# Patient Record
Sex: Female | Born: 1935 | Race: Black or African American | Hispanic: No | Marital: Married
Health system: Southern US, Community
[De-identification: ages and names within clinical notes are randomized; demographics above are authoritative.]

---

## 2008-11-17 ENCOUNTER — Inpatient Hospital Stay: Payer: Self-pay | Admitting: Internal Medicine

## 2011-12-31 ENCOUNTER — Emergency Department: Payer: Self-pay | Admitting: Emergency Medicine

## 2011-12-31 LAB — CBC WITH DIFFERENTIAL/PLATELET
Eosinophil #: 0.1 10*3/uL (ref 0.0–0.7)
Eosinophil %: 2 %
HCT: 40.2 % (ref 35.0–47.0)
Lymphocyte #: 1.7 10*3/uL (ref 1.0–3.6)
MCH: 27.3 pg (ref 26.0–34.0)
MCHC: 31.9 g/dL — ABNORMAL LOW (ref 32.0–36.0)
MCV: 85 fL (ref 80–100)
Monocyte #: 0.5 x10 3/mm (ref 0.2–0.9)
Monocyte %: 9.9 %
Neutrophil %: 57.5 %
RDW: 14.4 % (ref 11.5–14.5)

## 2011-12-31 LAB — COMPREHENSIVE METABOLIC PANEL
Alkaline Phosphatase: 122 U/L (ref 50–136)
BUN: 19 mg/dL — ABNORMAL HIGH (ref 7–18)
Calcium, Total: 9.2 mg/dL (ref 8.5–10.1)
Chloride: 100 mmol/L (ref 98–107)
Co2: 23 mmol/L (ref 21–32)
Creatinine: 0.65 mg/dL (ref 0.60–1.30)
EGFR (Non-African Amer.): >60
SGPT (ALT): 19 U/L
Sodium: 135 mmol/L — ABNORMAL LOW (ref 136–145)
Total Protein: 8.1 g/dL (ref 6.4–8.2)

## 2011-12-31 LAB — CK TOTAL AND CKMB (NOT AT ARMC)
CK, Total: 104 U/L (ref 21–215)
CK-MB: 2.9 ng/mL (ref 0.5–3.6)

## 2011-12-31 LAB — PROTIME-INR: Prothrombin Time: 12.6 secs (ref 11.5–14.7)

## 2012-03-01 ENCOUNTER — Inpatient Hospital Stay: Payer: Self-pay | Admitting: Internal Medicine

## 2012-03-01 DIAGNOSIS — I369 Nonrheumatic tricuspid valve disorder, unspecified: Secondary | ICD-10-CM

## 2012-03-01 LAB — TROPONIN I
Troponin-I: <0.02 ng/mL
Troponin-I: 0.02 ng/mL

## 2012-03-01 LAB — CBC WITH DIFFERENTIAL/PLATELET
Basophil #: 0 10*3/uL (ref 0.0–0.1)
Eosinophil #: 0.1 10*3/uL (ref 0.0–0.7)
HCT: 45.1 % (ref 35.0–47.0)
HGB: 14.6 g/dL (ref 12.0–16.0)
Lymphocyte #: 1.2 10*3/uL (ref 1.0–3.6)
Lymphocyte %: 20.2 %
MCH: 27.8 pg (ref 26.0–34.0)
MCHC: 32.3 g/dL (ref 32.0–36.0)
Monocyte #: 0.4 x10 3/mm (ref 0.2–0.9)
Neutrophil %: 69.9 %
RBC: 5.24 10*6/uL — ABNORMAL HIGH (ref 3.80–5.20)

## 2012-03-01 LAB — COMPREHENSIVE METABOLIC PANEL
Albumin: 3.7 g/dL (ref 3.4–5.0)
Anion Gap: 9 (ref 7–16)
BUN: 12 mg/dL (ref 7–18)
Calcium, Total: 9.5 mg/dL (ref 8.5–10.1)
Chloride: 106 mmol/L (ref 98–107)
EGFR (Non-African Amer.): >60
Glucose: 138 mg/dL — ABNORMAL HIGH (ref 65–99)
Osmolality: 283 (ref 275–301)
Potassium: 4.3 mmol/L (ref 3.5–5.1)
SGOT(AST): 33 U/L (ref 15–37)
SGPT (ALT): 21 U/L (ref 12–78)
Sodium: 141 mmol/L (ref 136–145)

## 2012-03-01 LAB — CK TOTAL AND CKMB (NOT AT ARMC)
CK, Total: 103 U/L (ref 21–215)
CK-MB: 3 ng/mL (ref 0.5–3.6)
CK-MB: 3.5 ng/mL (ref 0.5–3.6)

## 2012-03-02 LAB — CBC WITH DIFFERENTIAL/PLATELET
Basophil #: 0 10*3/uL (ref 0.0–0.1)
Eosinophil #: 0 10*3/uL (ref 0.0–0.7)
HCT: 41.2 % (ref 35.0–47.0)
Lymphocyte %: 20.6 %
MCH: 28.1 pg (ref 26.0–34.0)
Monocyte #: 0.2 x10 3/mm (ref 0.2–0.9)
Neutrophil #: 2.9 10*3/uL (ref 1.4–6.5)
Neutrophil %: 74.4 %
Platelet: 193 10*3/uL (ref 150–440)
RBC: 4.82 10*6/uL (ref 3.80–5.20)
RDW: 15.5 % — ABNORMAL HIGH (ref 11.5–14.5)
WBC: 3.9 10*3/uL (ref 3.6–11.0)

## 2012-03-02 LAB — COMPREHENSIVE METABOLIC PANEL
Albumin: 3.4 g/dL (ref 3.4–5.0)
Alkaline Phosphatase: 130 U/L (ref 50–136)
Bilirubin,Total: 0.2 mg/dL (ref 0.2–1.0)
Calcium, Total: 9.2 mg/dL (ref 8.5–10.1)
Co2: 28 mmol/L (ref 21–32)
EGFR (Non-African Amer.): >60
Glucose: 101 mg/dL — ABNORMAL HIGH (ref 65–99)
Osmolality: 276 (ref 275–301)
SGOT(AST): 48 U/L — ABNORMAL HIGH (ref 15–37)
SGPT (ALT): 32 U/L (ref 12–78)
Sodium: 137 mmol/L (ref 136–145)

## 2012-03-02 LAB — TROPONIN I: Troponin-I: 0.02 ng/mL

## 2012-03-02 LAB — CK TOTAL AND CKMB (NOT AT ARMC): CK, Total: 128 U/L (ref 21–215)

## 2012-03-04 LAB — PROTIME-INR: Prothrombin Time: 11.7 secs (ref 11.5–14.7)

## 2012-03-06 LAB — CREATININE, SERUM
EGFR (African American): >60
EGFR (Non-African Amer.): >60

## 2012-03-08 ENCOUNTER — Ambulatory Visit: Payer: Self-pay | Admitting: Internal Medicine

## 2012-03-11 LAB — EXPECTORATED SPUTUM ASSESSMENT W REFEX TO RESP CULTURE

## 2012-06-13 ENCOUNTER — Ambulatory Visit: Payer: Self-pay | Admitting: Specialist

## 2012-06-28 ENCOUNTER — Ambulatory Visit: Payer: Self-pay | Admitting: Internal Medicine

## 2014-06-29 IMAGING — CT NM PET TUM IMG LTD AREA
1 of 5 series · 10 of 25 positions shown · non-contrast
Comparison: none

REASON FOR EXAM: Lung mass
COMMENTS:

PROCEDURE:     PET - PET/CT DX LUNG CA  - June 28, 2012 [DATE]
RESULT:     Comparison: PET CT/[DATE], chest CT 06/13/2012
Radiopharmaceutical: 11.61 mCi F18-FDG, intravenously.
TECHNIQUE: Imaging was performed from the skull base to the mid-thigh using
routine PET/CT acquisition protocol.
Injection site: Right antecubital fossa
Time of FDG injection: 7327 hours
Serum glucose: 97 mg/dL
Time of imaging: 7373 hours through 6484 hours

[Series 3: ct wb 3.0 b30f · axial · 3.0mm · 0.98mm/px · z∈[-1344,-634]mm · 10 of 435 slices shown]
[im 40/435  soft-tissue]
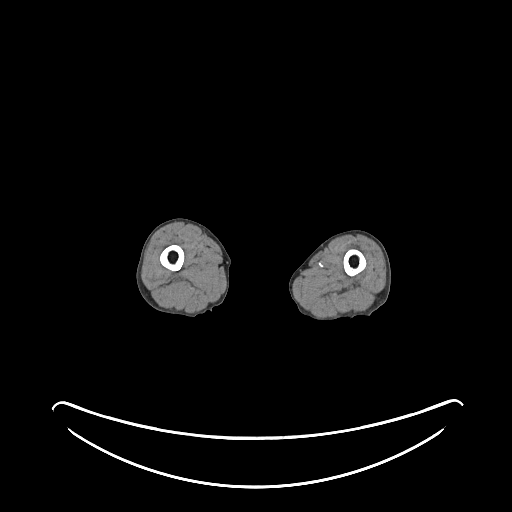
[im 79/435  soft-tissue]
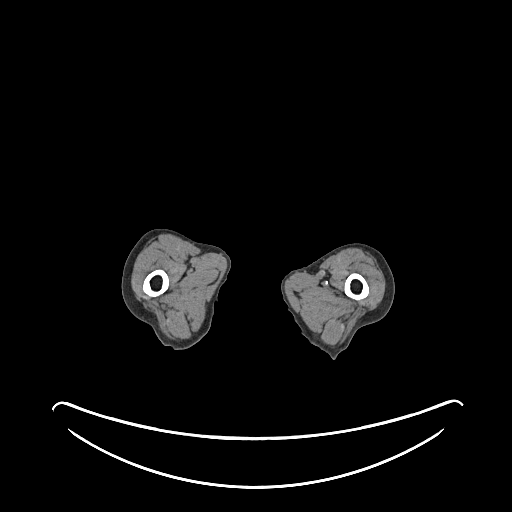
[im 119/435  soft-tissue]
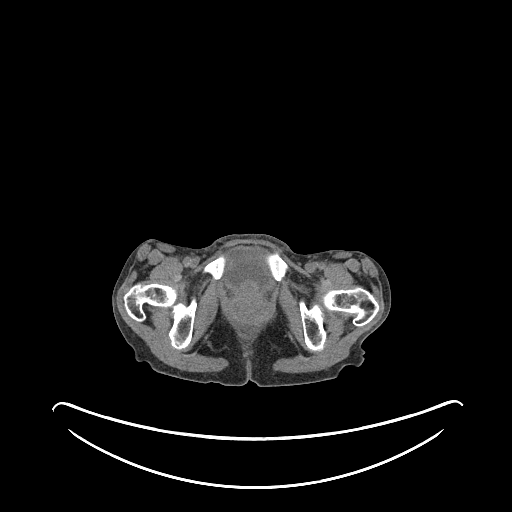
[im 158/435  soft-tissue]
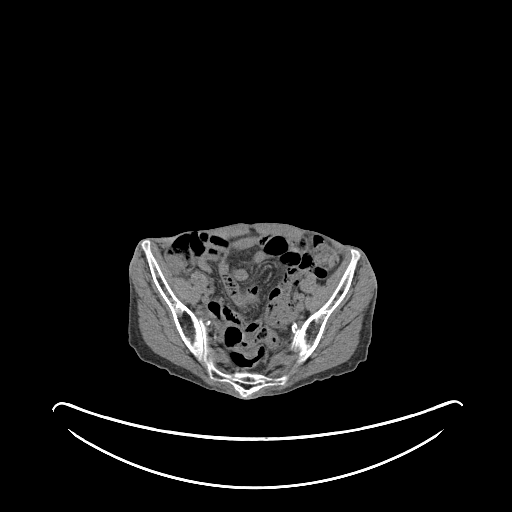
[im 198/435  soft-tissue]
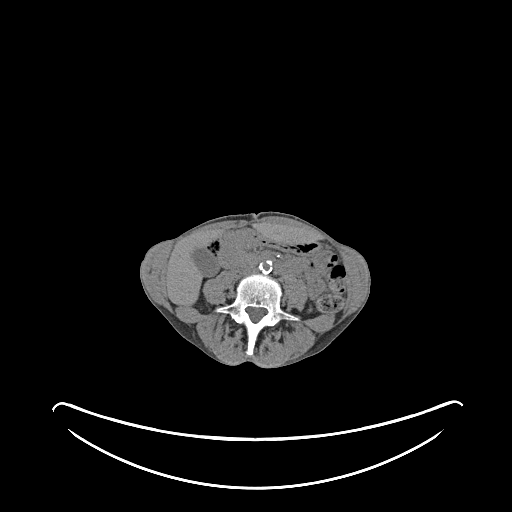
[im 237/435  soft-tissue]
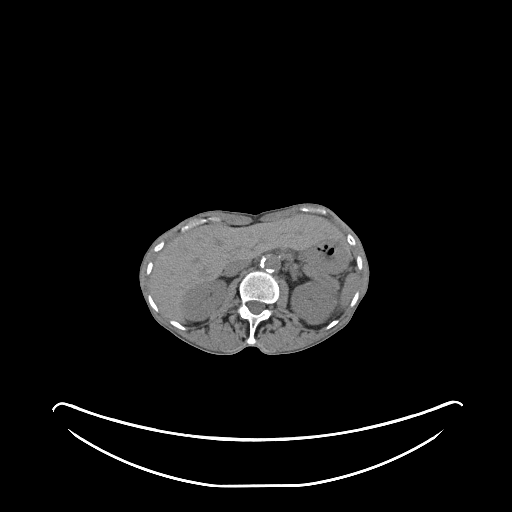
[im 277/435  soft-tissue]
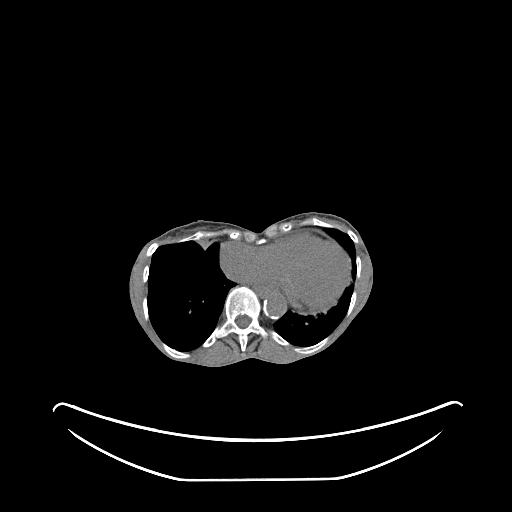
[im 316/435  soft-tissue]
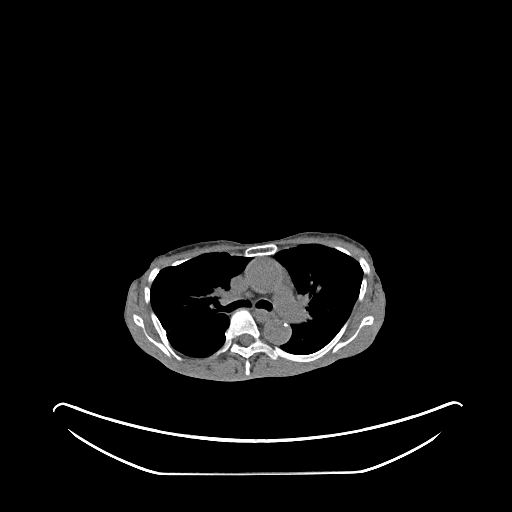
[im 356/435  soft-tissue]
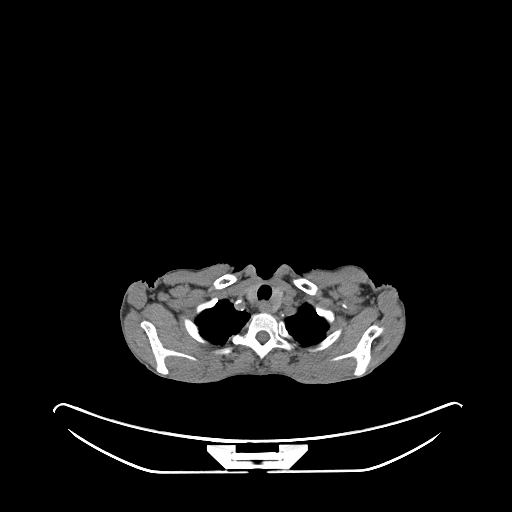
[im 395/435  soft-tissue]
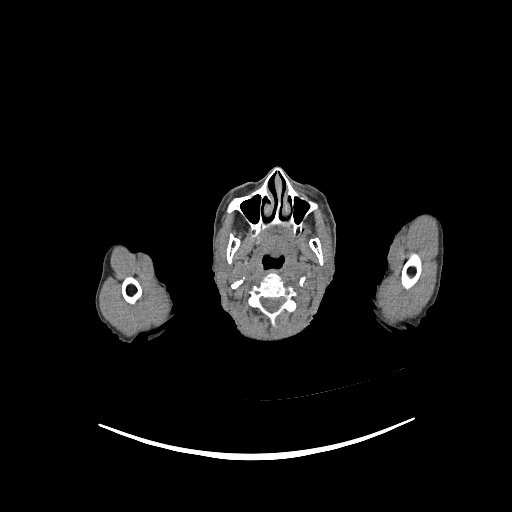

[10 of 25 positions shown; findings below may reference images not displayed]

FINDINGS: No hypermetabolic lymph nodes identified in the neck.

No hypermetabolic mediastinal, hilar, or axillary lymphadenopathy. The
irregular nodule with adjacent ground glass opacity in the posterior
superior right lower lobe demonstrates mild hypermetabolic activity, and SUV
max 2.4. This is slightly more conspicuous than prior. There are
heterogeneous nodular opacities in the right lower lobe which demonstrate
low level metabolic activity, SUV max 1.2. These are new from prior. The
smoothly marginated nodule in the left lower lobe demonstrates only low
level metabolic activity, SUV max 1.1. There is a 4 mm subpleural nodule in
the right upper lobe. There is a 4 mm nodule in the left lower lobe. 4 mm
nodule in the posterior left upper lobe is similar to prior.

No abnormal foci of hypermetabolic activity identified in the abdomen or
pelvis. There is physiologic radiotracer activity in the bowel and urinary
system. Subcentimeter hyperattenuating foci in the left kidney are
indeterminate, but too small to characterize. It is possible these represent
hemorrhagic cysts, but are too small to characterize.

There multiple old right anterolateral fractures.
IMPRESSION: 1. Small heterogeneous nodule in the superior segment of the right lower
lobe demonstrates mild hypermetabolic activity, which appears slightly more
conspicuous than prior. Malignancy is of differential concern.
2. The smoothly marginated nodule in the left lower lobe demonstrates only
low-level metabolic activity. This may be benign. Again, low grade
malignancy could also have a similar appearance.
3. There are new heterogeneous nodular opacities in the right lower lobe
which demonstrate low level metabolic activity. These are nonspecific and
may be infectious or inflammatory. Malignancy/metastatic disease would be
difficult to exclude.

## 2014-11-10 NOTE — Discharge Summary (Signed)
PATIENT NAMEGERRI, Maria Daugherty MR#:  [id number] DATE OF BIRTH:  1936/02/18  DATE OF ADMISSION:  03/01/2012 DATE OF DISCHARGE:  03/06/2012  ADDENDUM: Corrected medication list.   DISCHARGE MEDICATION: Resume Ventolin two puffs every 4 hours as needed.  ADDITIONAL MEDICATIONS:  1. Norco 5/325 mg 1 tablet every four hours as needed.  2. Metoprolol 25 p.o. twice daily.  3. Protonix 40 mg p.o. once daily.  4. Senokot 1 tablet p.o. twice daily as needed.  5. Colace 100 mg p.o. twice daily as needed.  6. Advair Diskus 250/50 one puff twice daily.  7. Tussionex 5 mL p.o. twice daily as needed.  8. Tiotropium one capsule inhalation once daily.  9. Lisinopril 40 p.o. twice daily.  10. Prednisone 30 mg p.o. once on 03/07/2012 and then taper x 10 daily until stopped.  11. Combivent Respimat CFC free one puff four times daily.   The patient was advised not to take aspirin until after bronchoscopy or by primary care physician's recommendations.  ____________________________ Theodoro Grist, MD rv:slb D: 03/26/2012 16:24:20 ET    T: 03/26/2012 16:32:28 ET       JOB#: [id number] cc: Theodoro Grist, MD, <Dictator> Herbon E. Raul Del, MD Tracie Harrier, MD [place] MD ELECTRONICALLY SIGNED 04/03/2012 22:01

## 2014-11-10 NOTE — Discharge Summary (Signed)
PATIENT NAMEALICE, Daugherty MR#:  [id number] DATE OF BIRTH:  [birth year]  DATE OF ADMISSION:  03/01/2012 DATE OF DISCHARGE:  03/06/2012  ADMITTING DIAGNOSIS: Chronic obstructive pulmonary disease exacerbation.   DISCHARGE DIAGNOSES:  1. Chronic obstructive pulmonary disease exacerbation.  2. Bacterial pneumonia.  3. Left lower lobe lung mass, positron emission tomography negative.  4. Acute pulmonary edema, congestive heart failure, level heart, acute diastolic with fluid overload.  5. Malignant hypertension.  6. Sinus tachycardia.  7. Remote tobacco abuse.  8. Constipation.   DISCHARGE CONDITION: Fair.   DISCHARGE MEDICATIONS: Patient is to resume her outpatient medications which are Ventolin HFA 2 puffs every four hours as needed.   ADDITIONAL MEDICATIONS:  1. Norco 5/325 mg 1 tablet q.4 hours as needed.  2. Metoprolol 25 mg p.o. twice daily.  3. Protonix 40 mg p.o. daily.  4. Symbicort 1 tablet p.o. twice daily as needed.  5. Colace 100 mg p.o. twice daily as needed.  6. Advair Diskus 250/50, 1 puff twice daily.  7. Tussionex 5 mL twice daily as needed.  8. Tiotropium 1 capsule once daily.  9. Lisinopril 40 mg p.o. twice daily.  10. Prednisone 30 mg p.o. once on 08/15, then taper x10 mg daily until stopped.  11. Combivent Respimat CFC free 1 puff inhalation 4 times daily.  12. Patient is not to take aspirin until after bronchoscopy.  HOME OXYGEN: None.   DIET: 2 gram salt, regular consistency.  PHYSICAL ACTIVITY LIMITATIONS: As tolerated.   FOLLOW UP: Follow-up appointment with Dr. Raul Del in 2 days after discharged, Dr. Ginette Pitman in 2 days after discharge.    CONSULTANTS:  1. Dr. Raul Del. 2. Care management.   HISTORY OF PRESENT ILLNESS: Patient is a 79 year old African American female with past medical history significant for history of chronic obstructive pulmonary disease, smoking in regards but quit in June presented to the hospital with complaints of shortness of  breath. Please refer to Dr. Ledell Peoples admission note on 03/01/2012. On arrival to the hospital, patient's temperature was 96.8, pulse was 113, blood pressure 164/84. Physical exam revealed bibasilar wheezing as well as rhonchi at bases with moderate to severe inspiratory effort and intercostal retractions.   LABORATORY, DIAGNOSTIC AND RADIOLOGICAL DATA: Chest PA and lateral 03/01/2012 showed chronic obstructive pulmonary disease with possible underlying component of interstitial fibrosis, pulmonary vascular congestion if a clinical concern cannot be excluded. CT of chest for pulmonary embolism with contrast 03/01/2012 cardiomegaly, no pulmonary embolism or thoracic aortic aneurysm, mild prominence of the ascending thoracic aorta noted. Mass in the left lower lobe with additional areas of nodularity are described bilaterally. Underlying malignancy is not excluded. Occluded bronchus in right lower lobe. Band of atelectasis in the right middle lobe area are also noted. Repeated chest x-ray 03/04/2012 showed chronic obstructive pulmonary disease with underlying component of pulmonary interstitial infiltrate likely representing edema. Scarring versus small effusion on the left. PET CT scan 03/05/2012 for bilateral lung mass evaluation impression: There is 1.5/1 cm left lower lobe pulmonary nodule demonstrating no significant hypermetabolic activity. There is 1.2/1.0 cm spiculated pulmonary nodule in the superior segment of right lower lobe demonstrates no significant hypermetabolic activity. Differential considerations include a non-metabolically active malignancy such as bronchioalveolar carcinoma versus a hamartoma or other benign nodule. At the very least a follow up CT of chest is recommended in three months.   Patient's lab data done on 03/01/2012 revealed glucose 138, otherwise BMP was unremarkable. Patient's liver enzymes were normal. Cardiac enzymes, first set as well  as subsequent two more sets were within  normal limits. Patient's CBC showed normal white blood cell count of 5.8, hemoglobin 14.6, platelet count 209, absolute neutrophil count was normal at 4.0. D-dimer was elevated at 1.49. Blood cultures x1 did not show any growth. ABGs on 28% FiO2 showed pH 7.36, pCO2 42, pO2 83, saturation 95.7% EKG revealed sinus tachycardia at 127 beats per minute, biatrial enlargement, normal axis, low voltage QRS, inferior infarct age undetermined. Chest x-ray revealed pulmonary fibrosis as well as possible pulmonary vascular congestion. CT scan of chest was performed and it showed prior mentioned concerns such as left lower lobe nodularity, questionable malignancy as well as occluded bronchus in right lower lobe.   HOSPITAL COURSE: Patient was admitted to the hospital and started on antibiotic therapy as well as inhalation therapy and steroids. With this she improved significantly. Because of concerns of pulmonary malignancy consultation with Dr. Raul Del was obtained. Dr. Raul Del followed patient along. Patient underwent PET CT scanning which did not show significant uptake. Unfortunately, because of scheduling issues bronchoscopy could not have been performed despite long waiting in the hospital up until Friday, 03/08/2012. As patient improved significantly and she was on room air and felt satisfactorily it was felt that it would be best served to discharge her home and she would follow up with her primary care physician as well as Dr. Raul Del for management and making decisions about bronchoscopy at a later date. While in the hospital patient was rehydrated because of concerns of some dehydration and she went into acute overt pulmonary edema. On 03/04/2012 repeated chest x-ray was done which showed likely pulmonary edema. Patient's IV fluids were stopped and diuretics were administered x1. It was felt that patient's pulmonary edema was related to administration of IV fluids but also significantly elevated blood pressure.  Patient's blood pressure was found to be markedly elevated as high as 180s up to 220s. Blood pressure medications were initiated and advanced to current levels. By the day of discharge patient's blood pressure is much better controlled. Her vital signs on 03/06/2012: Temperature 97.5, pulse 68, respiration rate 16, blood pressure 160/69, saturation 96% on room air at rest. Patient had echocardiogram performed while she was in the hospital because of shortness of breath as well as congestive heart failure and it showed technically difficult study. Left ventricular cavity was found to be small. Left ventricle was hyperdynamic. Ejection fraction equal or more than 55%. Transmitral spectral Doppler flow pattern was suggestive of impaired left ventricular relaxation. There was severe concentric left ventricular hypertrophy and mild tricuspid regurgitation. Right ventricular systolic pressures were elevated at 30 to 40 mmHg. Moderate anterior pericardial effusion was also noted. Patient was started on ACE inhibitors and she is to continue ACE inhibitors as well as follow up with primary care physician for further recommendations, possibly even cardiology evaluation if needed. Patient completed antibiotic course for bacterial pneumonia and her antibiotics were discontinued. In regards to left lower lung mass, as mentioned above it was PET negative and bronchoscopy should be performed as outpatient.   Patient was complaining of some constipation. She was started on medications to relieve her constipation. Patient is being discharged in stable condition with above-mentioned medications and follow up.   TIME SPENT: 40 minutes.   ____________________________ Theodoro Grist, MD rv:cms D: 03/06/2012 20:33:54 ET T: 03/07/2012 16:02:58 ET JOB#: [id number]  cc: Theodoro Grist, MD, <Dictator> Tracie Harrier, MD Herbon E. Raul Del, MD Theodoro Grist MD ELECTRONICALLY SIGNED 03/12/2012 14:22

## 2014-11-10 NOTE — H&P (Signed)
PATIENT NAMERAJAH, Daugherty MR#:  [id number] DATE OF BIRTH:  1935-12-13  DATE OF ADMISSION:  03/01/2012  PRIMARY CARE PHYSICIAN: None.   SUBJECTIVE: This is a 79 year old female who presents to the ER for shortness of breath. The patient states that she quit smoking in June and in June she was in the ER for the same and was discharged from the ER. The patient has Ventolin inhaler that she has been using every four hours without relief. Her shortness of breath has been progressive since June. Today she is unable to speak in full sentences. She is also wheezing. She has a nonproductive cough, however, she denies any fever, chills, or rigors. The patient has no current primary care provider. Previously her PCP was in [place]. She has an upcoming appointment with Dr. Tracie Harrier at [facility] on the 15th but has not seen him yet. The patient also has lost 30 pounds over the last three months. The patient does not use oxygen at home.   PAST MEDICAL HISTORY:  1. History of chronic obstructive pulmonary disease. 2. Peripheral vascular disease. 3. Dyslipidemia.   PAST SURGICAL HISTORY: Tubal pregnancy and tubal surgery.   ALLERGIES: No known drug allergies.   HOME MEDICATIONS: Ventolin inhaler.   SOCIAL HISTORY: Quit smoking in June. Positive for alcohol, occasional beers on the weekend. No history of drug use. The patient lives with her husband and her daughter.   FAMILY HISTORY: Mother died of leukemia. Father died of natural causes at the age of 12. Siblings are healthy.   REVIEW OF SYSTEMS: CONSTITUTIONAL: Negative for fever but positive for weakness, weight loss. EYES: Denies any double vision, blurry vision, redness, inflammation, glaucoma, cataracts. ENT: Denies any tinnitus, ear pain, hearing loss, seasonal allergies, epistaxis. RESPIRATORY: Positive for cough and wheezing. No hemoptysis. Positive for COPD. CARDIOVASCULAR: No chest pain, orthopnea, edema, arrhythmia, dyspnea on  exertion, palpitations, or syncope. GI: No nausea, vomiting, diarrhea, abdominal pain, hematemesis. GU: No dysuria, hematuria, renal calculi, or frequency. ENDOCRINE: No polyuria, nocturia, thyroid problems, increased sweating, heat or cold intolerance. HEME: No anemia, easy bruising, bleeding, or swollen glands. INTEGUMENTARY: No acne, rash, change in mole, hair or skin. MUSCULOSKELETAL: No neck pain, back pain, shoulder pain, hip pain, arthritis. NEUROLOGIC: No numbness, weakness, dysarthria, epilepsy, tremor, vertigo, ataxia. PSYCHIATRIC: No anxiety, insomnia, bipolar disorder, depression, schizophrenia, or nervousness.   PHYSICAL EXAMINATION:   VITAL SIGNS: Blood pressure 164/84, pulse 113, temperature 96.8.   GENERAL: The patient appears to be conversant, alert, oriented x3, however, in moderate to severe respiratory distress.   EYES: Anicteric sclerae. Moist conjunctivae. No lid lag. Pupils equal and reactive.   HEENT: Atraumatic. Oropharynx dry. Mucous membranes and oral mucosa without ulcerations.   NECK: Trachea in the midline. Neck is supple. No JVD. No thyromegaly. No lymphadenopathy.   LUNGS: Bibasilar wheezing and rhonchi at the bases with moderate to severe respiratory effort and intercostal retraction.   CARDIOVASCULAR: Tachycardia but no murmurs, rubs, or gallops. PMI is nondisplaced.   ABDOMEN: Soft, nontender, nondistended. No masses. No hepatosplenomegaly. Normoactive bowel sounds.   EXTREMITIES: Without any dependent edema.   SKIN: Without any skin rashes. Normal skin turgor. No ulceration. No subcutaneous nodules.   PSYCHIATRIC: Appropriate mood and affect.   LABORATORY, DIAGNOSTIC, AND RADIOLOGICAL DATA: WBC 5.8, hemoglobin 14.6, hematocrit 45.1, platelet count 209, serum glucose 138, BUN 12, creatinine 0.8, sodium 141, potassium 4.3, chloride 106, total bilirubin 0.3, alkaline phosphatase 121, ALT 21, AST 33, albumin 3.7. Troponin less than  0.02.   Chest x-ray COPD  with possible underlying fibrosis, pulmonary vascular congestion is of concern.   ASSESSMENT:  1. Chronic obstructive pulmonary disease exacerbation likely secondary to acute bronchitis. No obvious pneumonia.  2. Weight loss, concerning for underlying malignancy.  3. Sinus tachycardia, rule out pulmonary embolism.   PLAN:  1. The patient will be admitted to telemetry. We will cycle cardiac enzymes. Given her sinus tachycardia, the patient will have a D-dimer. If elevated, will do a CT of the chest to rule out underlying pulmonary embolism.  2. For COPD exacerbation, will start her on Rocephin, azithromycin, IV Solu-Medrol, DuoNebs, and Advair.   Will sign off to Dr. Ginette Pitman or  PCP on call for [facility].   CODE STATUS: She is a FULL CODE.   TIME SPENT: 70 minutes.   ____________________________ Reyne Dumas, MD na:drc D: 03/01/2012 16:47:40 ET T: 03/01/2012 17:20:14 ET JOB#: [id number]  cc: Reyne Dumas, MD, <Dictator> Tracie Harrier, MD Reyne Dumas MD ELECTRONICALLY SIGNED 03/01/2012 19:15

## 2017-07-02 DIAGNOSIS — N390 Urinary tract infection, site not specified: Secondary | ICD-10-CM | POA: Diagnosis not present

## 2017-07-02 DIAGNOSIS — R0603 Acute respiratory distress: Secondary | ICD-10-CM | POA: Diagnosis not present

## 2017-07-02 DIAGNOSIS — I10 Essential (primary) hypertension: Secondary | ICD-10-CM | POA: Diagnosis not present

## 2017-07-02 DIAGNOSIS — R918 Other nonspecific abnormal finding of lung field: Secondary | ICD-10-CM | POA: Diagnosis not present

## 2017-07-02 DIAGNOSIS — C7972 Secondary malignant neoplasm of left adrenal gland: Secondary | ICD-10-CM | POA: Diagnosis not present

## 2017-07-02 DIAGNOSIS — C7802 Secondary malignant neoplasm of left lung: Secondary | ICD-10-CM | POA: Diagnosis not present

## 2017-07-02 DIAGNOSIS — J189 Pneumonia, unspecified organism: Secondary | ICD-10-CM | POA: Diagnosis not present

## 2017-07-02 DIAGNOSIS — Z9981 Dependence on supplemental oxygen: Secondary | ICD-10-CM | POA: Diagnosis not present

## 2017-07-02 DIAGNOSIS — I493 Ventricular premature depolarization: Secondary | ICD-10-CM | POA: Diagnosis not present

## 2017-07-02 DIAGNOSIS — R000 Tachycardia, unspecified: Secondary | ICD-10-CM | POA: Diagnosis not present

## 2017-07-02 DIAGNOSIS — J441 Chronic obstructive pulmonary disease with (acute) exacerbation: Secondary | ICD-10-CM | POA: Diagnosis not present

## 2017-07-02 DIAGNOSIS — G9340 Encephalopathy, unspecified: Secondary | ICD-10-CM | POA: Diagnosis not present

## 2017-07-02 DIAGNOSIS — R05 Cough: Secondary | ICD-10-CM | POA: Diagnosis not present

## 2017-07-02 DIAGNOSIS — I517 Cardiomegaly: Secondary | ICD-10-CM | POA: Diagnosis not present

## 2017-07-02 DIAGNOSIS — J9809 Other diseases of bronchus, not elsewhere classified: Secondary | ICD-10-CM | POA: Diagnosis not present

## 2017-07-02 DIAGNOSIS — J984 Other disorders of lung: Secondary | ICD-10-CM | POA: Diagnosis not present

## 2017-07-02 DIAGNOSIS — J9601 Acute respiratory failure with hypoxia: Secondary | ICD-10-CM | POA: Diagnosis not present

## 2017-07-02 DIAGNOSIS — R9431 Abnormal electrocardiogram [ECG] [EKG]: Secondary | ICD-10-CM | POA: Diagnosis not present

## 2017-07-02 DIAGNOSIS — Z7409 Other reduced mobility: Secondary | ICD-10-CM | POA: Diagnosis not present

## 2017-07-02 DIAGNOSIS — J9602 Acute respiratory failure with hypercapnia: Secondary | ICD-10-CM | POA: Diagnosis not present

## 2017-07-02 DIAGNOSIS — J449 Chronic obstructive pulmonary disease, unspecified: Secondary | ICD-10-CM | POA: Diagnosis not present

## 2017-07-02 DIAGNOSIS — I252 Old myocardial infarction: Secondary | ICD-10-CM | POA: Diagnosis not present

## 2017-07-02 DIAGNOSIS — E43 Unspecified severe protein-calorie malnutrition: Secondary | ICD-10-CM | POA: Diagnosis not present

## 2017-07-02 DIAGNOSIS — E46 Unspecified protein-calorie malnutrition: Secondary | ICD-10-CM | POA: Diagnosis not present

## 2017-07-02 DIAGNOSIS — D491 Neoplasm of unspecified behavior of respiratory system: Secondary | ICD-10-CM | POA: Diagnosis not present

## 2017-07-02 DIAGNOSIS — C7801 Secondary malignant neoplasm of right lung: Secondary | ICD-10-CM | POA: Diagnosis not present

## 2017-07-02 DIAGNOSIS — N179 Acute kidney failure, unspecified: Secondary | ICD-10-CM | POA: Diagnosis not present

## 2017-07-02 DIAGNOSIS — R0602 Shortness of breath: Secondary | ICD-10-CM | POA: Diagnosis not present

## 2017-07-09 ENCOUNTER — Other Ambulatory Visit: Payer: Self-pay | Admitting: *Deleted

## 2017-07-09 NOTE — Patient Outreach (Signed)
[place] [facility]) Care Management  07/09/2017  Maria Daugherty 05/31/1936 [id number]   Transition of Care Referral  Referral Date: 07/09/17 Referral Source: [employer] Date of Discharge: 07/06/17 Facility: [facility] Discharge Diagnosis: N/A Insurance: [employer]  Outreach attempt # 1 to patient. Patient's son reported patient as being deceased.   Plan: RN CM will notify [facility] Case Management Assistant regarding case closure.   Lake Bells, RN, BSN, MHA/MSL, [place] Telephonic Care Manager Coordinator [facility] Direct Phone: [phone] Toll Free: [phone] Fax: [phone]

## 2017-07-24 DEATH — deceased
